# Patient Record
Sex: Female | Born: 1935 | Race: Black or African American | Hispanic: No | Smoking: Former smoker
Health system: Southern US, Community
[De-identification: ages and names within clinical notes are randomized; demographics above are authoritative.]

## PROBLEM LIST (undated history)

## (undated) DIAGNOSIS — K219 Gastro-esophageal reflux disease without esophagitis: Secondary | ICD-10-CM

## (undated) DIAGNOSIS — F32A Depression, unspecified: Secondary | ICD-10-CM

## (undated) DIAGNOSIS — F329 Major depressive disorder, single episode, unspecified: Secondary | ICD-10-CM

## (undated) DIAGNOSIS — E039 Hypothyroidism, unspecified: Secondary | ICD-10-CM

## (undated) HISTORY — PX: EYE SURGERY: SHX253

## (undated) HISTORY — PX: COLONOSCOPY: SHX174

---

## 1999-01-03 ENCOUNTER — Encounter: Payer: Self-pay | Admitting: Cardiology

## 1999-01-03 ENCOUNTER — Ambulatory Visit (HOSPITAL_COMMUNITY): Admission: RE | Admit: 1999-01-03 | Discharge: 1999-01-03 | Payer: Self-pay | Admitting: Cardiology

## 2003-07-01 ENCOUNTER — Encounter: Admission: RE | Admit: 2003-07-01 | Discharge: 2003-07-01 | Payer: Self-pay | Admitting: Cardiology

## 2003-07-03 ENCOUNTER — Encounter: Admission: RE | Admit: 2003-07-03 | Discharge: 2003-07-03 | Payer: Self-pay | Admitting: Cardiology

## 2005-11-24 ENCOUNTER — Ambulatory Visit (HOSPITAL_COMMUNITY): Admission: RE | Admit: 2005-11-24 | Discharge: 2005-11-24 | Payer: Self-pay | Admitting: Neurosurgery

## 2005-12-27 ENCOUNTER — Encounter: Admission: RE | Admit: 2005-12-27 | Discharge: 2005-12-27 | Payer: Self-pay | Admitting: Neurosurgery

## 2006-01-17 ENCOUNTER — Encounter: Admission: RE | Admit: 2006-01-17 | Discharge: 2006-01-17 | Payer: Self-pay | Admitting: Neurosurgery

## 2007-07-17 ENCOUNTER — Ambulatory Visit (HOSPITAL_COMMUNITY): Admission: RE | Admit: 2007-07-17 | Discharge: 2007-07-17 | Payer: Self-pay | Admitting: Cardiology

## 2007-07-17 IMAGING — US US ABDOMEN COMPLETE
1 series · 14 of 25 positions shown · non-contrast
Comparison: None.

CLINICAL DATA: Abdominal pain, left lower side pain.
 ABDOMEN ULTRASOUND:
TECHNIQUE: Complete abdominal ultrasound examination was performed including evaluation of the liver, gallbladder, bile ducts, pancreas, kidneys, spleen, IVC, and abdominal aorta.

[Series 1: unknown · 0.33mm/px · 14 of 58 slices shown]
[im 1/58]
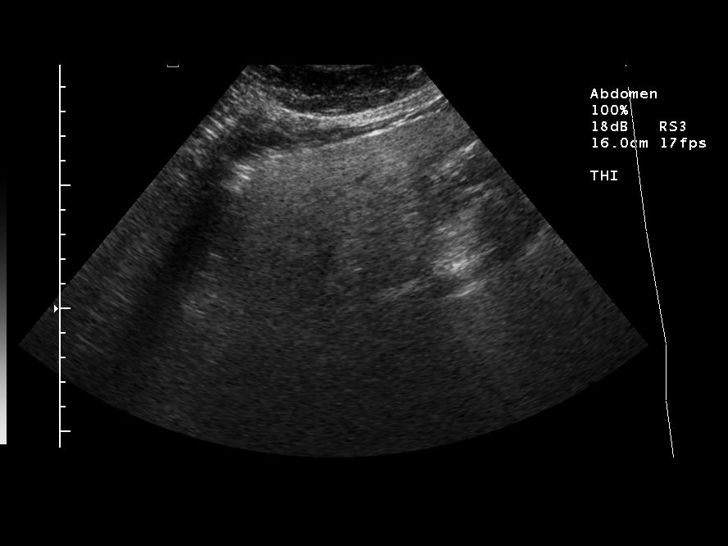
[im 5/58]
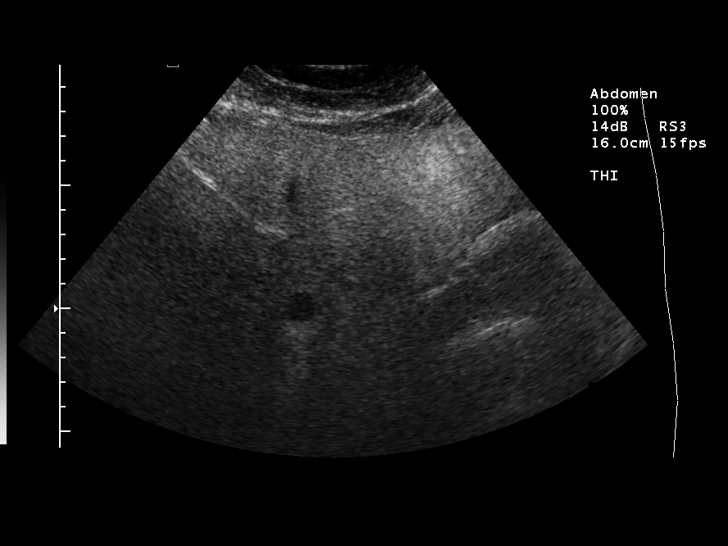
[im 10/58]
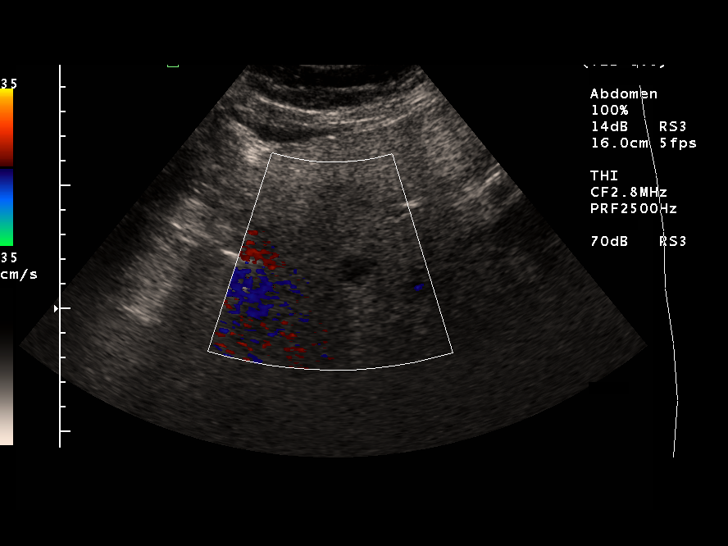
[im 15/58]
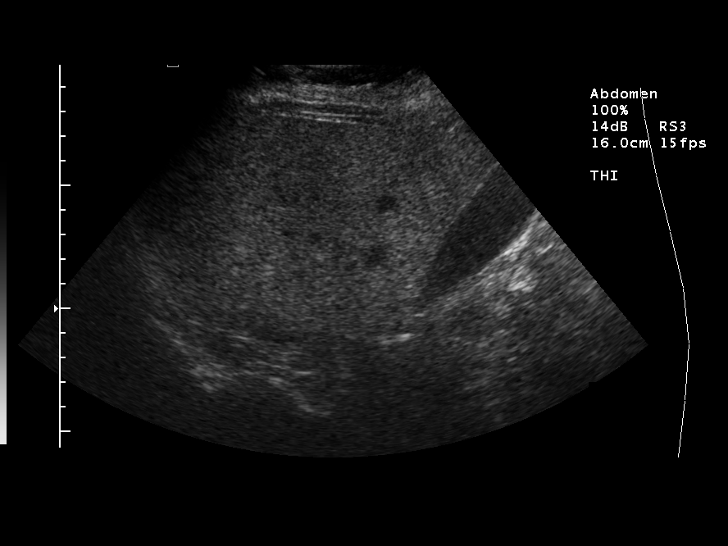
[im 20/58]
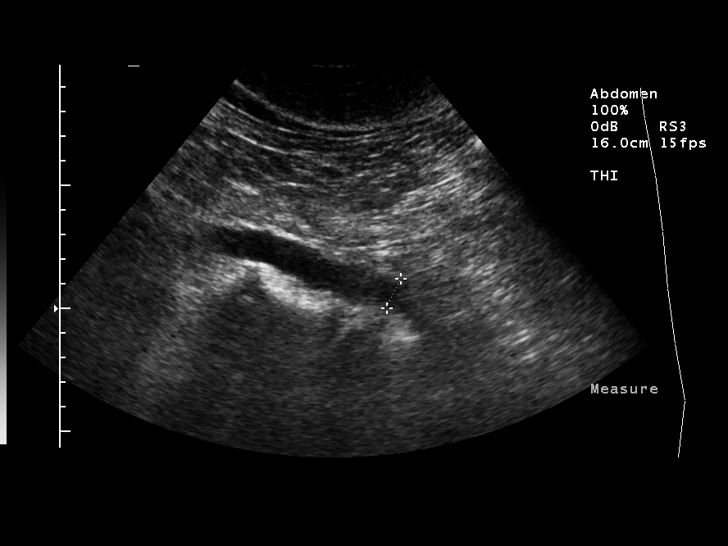
[im 22/58]
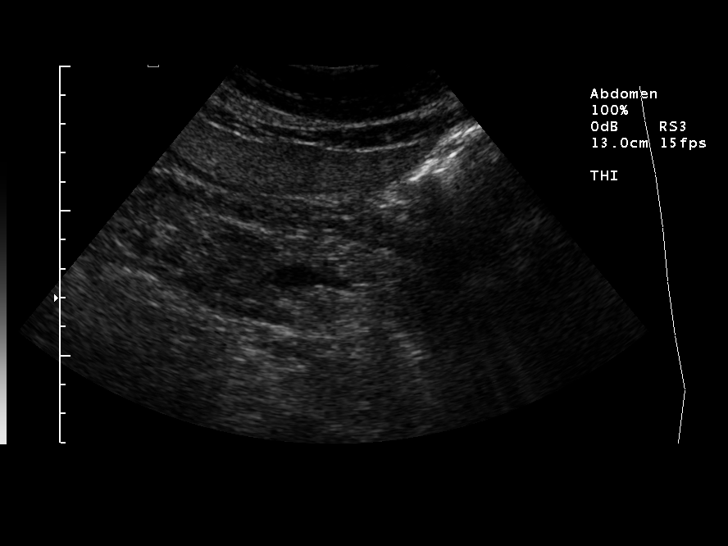
[im 27/58]
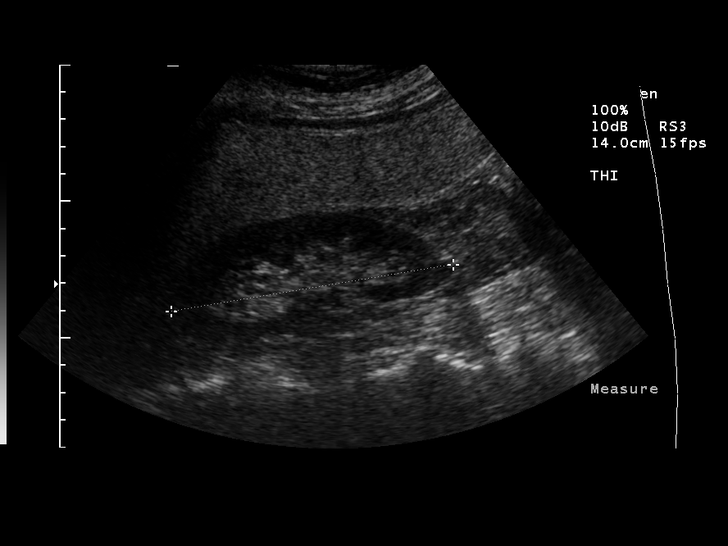
[im 31/58]
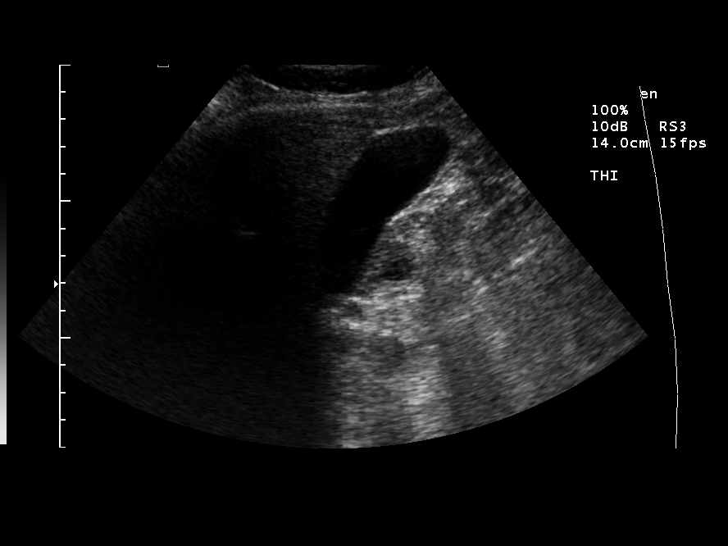
[im 36/58]
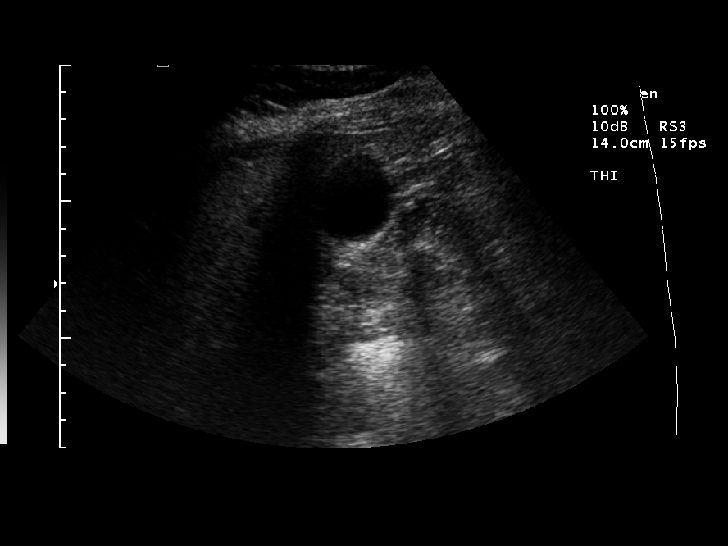
[im 39/58]
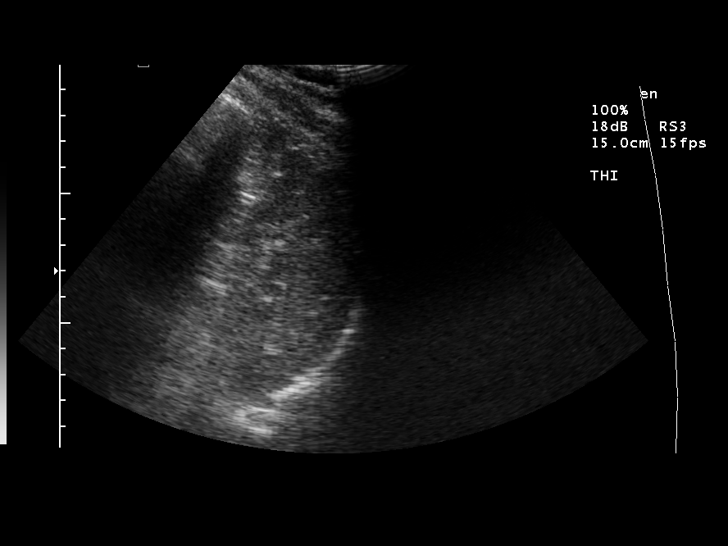
[im 43/58]
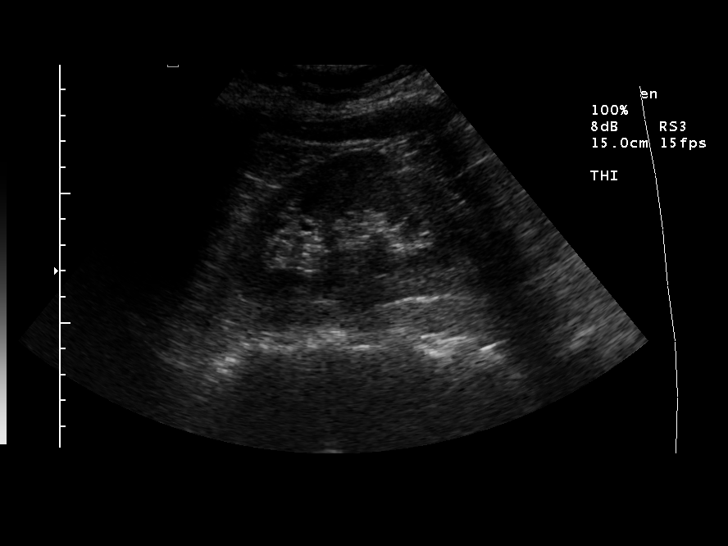
[im 48/58]
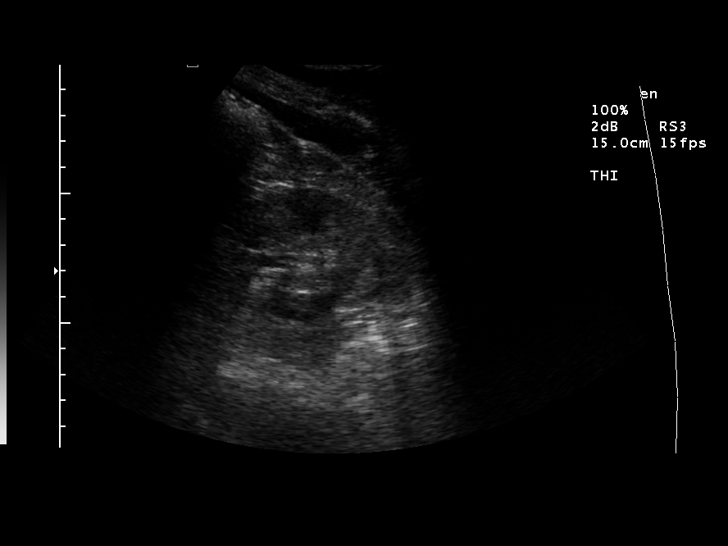
[im 53/58]
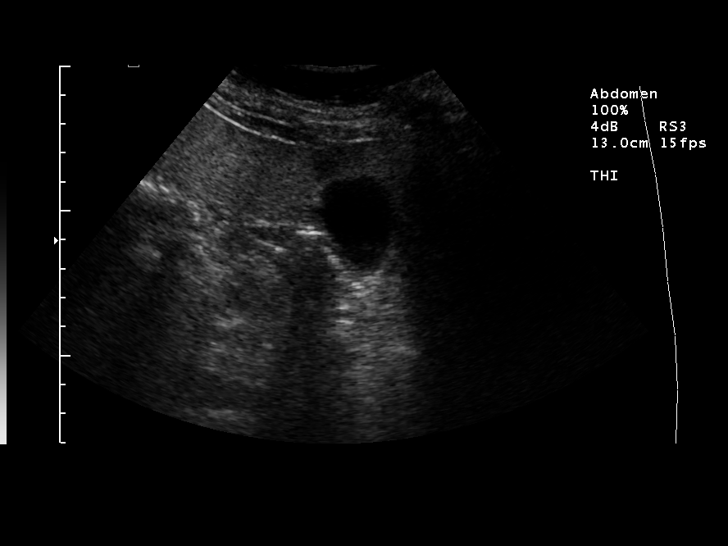
[im 58/58]
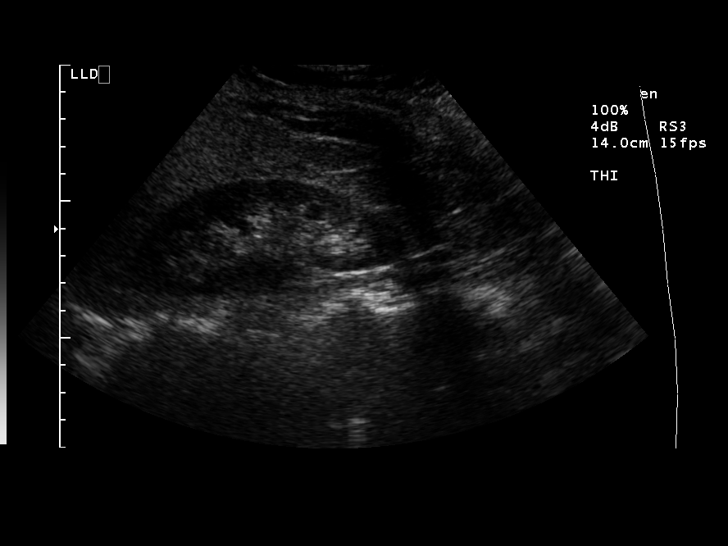

[14 of 25 positions shown; findings below may reference images not displayed]

FINDINGS: Liver is increased in echogenicity and contains a 1.4 cm anechoic lesion with increased through-transmission, consistent with a cyst.  Gallbladder, extrahepatic bile duct, IVC, pancreas, spleen, kidneys, and aorta unremarkable.
IMPRESSION: Fatty liver.

## 2007-11-14 ENCOUNTER — Encounter: Admission: RE | Admit: 2007-11-14 | Discharge: 2007-11-14 | Payer: Self-pay | Admitting: Neurosurgery

## 2008-06-19 HISTORY — PX: BACK SURGERY: SHX140

## 2008-06-23 ENCOUNTER — Emergency Department (HOSPITAL_COMMUNITY): Admission: EM | Admit: 2008-06-23 | Discharge: 2008-06-23 | Payer: Self-pay | Admitting: Emergency Medicine

## 2008-06-30 ENCOUNTER — Inpatient Hospital Stay (HOSPITAL_COMMUNITY): Admission: RE | Admit: 2008-06-30 | Discharge: 2008-07-03 | Payer: Self-pay | Admitting: Neurosurgery

## 2009-10-27 ENCOUNTER — Ambulatory Visit (HOSPITAL_COMMUNITY): Admission: RE | Admit: 2009-10-27 | Discharge: 2009-10-27 | Payer: Self-pay | Admitting: Cardiology

## 2010-07-10 ENCOUNTER — Encounter: Payer: Self-pay | Admitting: Cardiology

## 2010-10-03 LAB — CBC
HCT: 43.8 % (ref 36.0–46.0)
Hemoglobin: 14.1 g/dL (ref 12.0–15.0)
MCHC: 32.2 g/dL (ref 30.0–36.0)
Platelets: 298 10*3/uL (ref 150–400)
RBC: 5.07 MIL/uL (ref 3.87–5.11)
RDW: 13.8 % (ref 11.5–15.5)

## 2010-10-03 LAB — POCT I-STAT, CHEM 8
Calcium, Ion: 1.17 mmol/L (ref 1.12–1.32)
Glucose, Bld: 154 mg/dL — ABNORMAL HIGH (ref 70–99)
HCT: 43 % (ref 36.0–46.0)
Hemoglobin: 14.6 g/dL (ref 12.0–15.0)

## 2010-10-03 LAB — URINE MICROSCOPIC-ADD ON

## 2010-10-03 LAB — URINALYSIS, ROUTINE W REFLEX MICROSCOPIC
Bilirubin Urine: NEGATIVE
Glucose, UA: NEGATIVE mg/dL
Nitrite: NEGATIVE
Specific Gravity, Urine: 1.02 (ref 1.005–1.030)

## 2010-11-01 NOTE — Discharge Summary (Signed)
NAMEVEYDA, KAUFMAN                   ACCOUNT NO.:  [id number]   MEDICAL RECORD NO.:  [id number]          PATIENT TYPE:  INP   LOCATION:  3033                         FACILITY:  [facility]   PHYSICIAN:  Coletta Memos, M.D.     DATE OF BIRTH:  03-Mar-1933   DATE OF ADMISSION:  06/30/2008  DATE OF DISCHARGE:  07/03/2008                               DISCHARGE SUMMARY   ADMITTING DIAGNOSIS:  Recurrent disk herniation, left L4-5.   DISCHARGE DIAGNOSIS:  Recurrent disk herniation, left L4-5.   PROCEDURE:  Left L4-5 redo semihemilaminectomy and diskectomy with  microdissection.   COMPLICATIONS:  None.   DISCHARGE STATUS:  Alive and well.   MEDICATIONS:  1. Percocet for pain 1 p.o. q.6 p.r.n. pain 5/325.  2. Flexeril 10 mg p.o. t.i.d. p.r.n. spasms.   Wound is clean and dry.  No signs of infection at discharge.  She is  tolerating a regular diet, has voided and ambulated without difficulty.  Ms. Defrain presented with acute pain in left lower extremity secondary to  a displaced disk.  I took her to the operating room and she underwent an  uncomplicated diskectomy.  Postoperatively, she has done well.  She was  reluctant to leave the hospital and is now ready to leave.           ______________________________  Coletta Memos, M.D.     KC/MEDQ  D:  07/03/2008  T:  07/04/2008  Job:  [id number]

## 2010-11-01 NOTE — Op Note (Signed)
NAMECAMESHA, Gomez                   ACCOUNT NO.:  [id number]   MEDICAL RECORD NO.:  [id number]          PATIENT TYPE:  INP   LOCATION:  3033                         FACILITY:  [facility]   PHYSICIAN:  Coletta Memos, M.D.     DATE OF BIRTH:  1933-01-13   DATE OF PROCEDURE:  06/30/2008  DATE OF DISCHARGE:                               OPERATIVE REPORT   PREOPERATIVE DIAGNOSES:  1. Recurrent displaced disk, left L4-5.  2. Lumbar radiculopathy.   POSTOPERATIVE DIAGNOSES:  1. Recurrent displaced disk, left L4-5.  2. Lumbar radiculopathy.   PROCEDURE:  Redo diskectomy L4-5 with microdissection.   SURGEON:  Coletta Memos, MD   ASSISTANT:  Payton Doughty, MD   COMPLICATIONS:  None.   INDICATIONS:  [name] is a 77 year old who I have followed for a long  period of time in my office.  [name] has always denied surgery.  However, approximately 2 weeks ago she had acute onset of severe pain in  the left lower extremity, bad enough that she came to the emergency room  seeking medical attention.  MRI was performed and it showed a recurrent  displaced disk on the left side at L4-5.  She had had her operation in  the distant past well over 10 years ago.  I therefore elected to offer  her a operative solution and she elected to undergo operative  decompression.   OPERATIVE NOTE:  [name] was brought to the operating room, intubated  and placed under general anesthetic.  Her back was prepped and she was  draped in a sterile fashion after being rolled onto a [place] table.  A  Foley catheter was placed because it was certainly planned to have her  undergo a fusion if that was necessary.  I opened the old incision and  took this down to the thoracolumbar fascia.  I first localized the L2-3  disk space.  Then I opened the lower portion of her previous incision  and was able to then identify the L4-5 space.  I opened the disk space.  I performed a semi-hemilaminectomy using Kerrison punches and a  high-  speed drill.  Brought the microscope in and was able to remove some  overlying scar tissue.  I was then able to identify the thecal sac and  dissect around that using curved curettes.  I then was able to remove  more bone laterally until I felt that I had enough working room between  the thecal sac and the lateral margin of the bone.  The pars  interarticularis of L4 was intact and the facet site did not appear to  be particularly mobile, so I felt that I would be able to perform a  diskectomy without having to perform a fusion.  I then used the [facility] and was able to find the disk space and with that introduced  a number of curettes and rongeurs with Dr. Temple Pacini help and with  microdissection to empty the disk space and remove the herniated disk  material.  The thecal sac was very  well decompressed at the end of the  case.  Scar tissue were surrounding the nerve roots, but certainly the  pressure that had been there was removed.  I irrigated.  I then closed  with Dr. Temple Pacini assistance using Vicryl sutures in a layered fashion,  reapproximating the thoracolumbar fascia subcutaneous and in  subcuticular layers.  I used Dermabond for sterile dressing.  [name]  was then awakened, taken off the bed, extubated, and moving all  extremities.           ______________________________  Coletta Memos, M.D.     KC/MEDQ  D:  06/30/2008  T:  07/01/2008  Job:  [id number]

## 2014-02-09 ENCOUNTER — Other Ambulatory Visit: Payer: Self-pay | Admitting: Ophthalmology

## 2014-02-09 MED ORDER — TETRACAINE HCL 0.5 % OP SOLN: 1.0000 [drp] | OPHTHALMIC | Status: DC

## 2014-02-09 NOTE — H&P (Signed)
History & Physical:   DATE:   02-09-14  NAME:  [name], [name]     [id number]       HISTORY OF PRESENT ILLNESS: Chief Eye Complaints  Glaucoma  patient:  patient states it seems as if vision is decreasing, c/o having to wear glasses all the time. No pain or floaters.   HPI: EYES: Reports symptoms of  light sensitive , difficulty watching TV, ( see visual status form).  LOCATION:   BOTH EYES        QUALITY/COURSE:   Reports condition is worsening.        INTENSITY/SEVERITY:    Reports measurement ( or degree) as moderate.      DURATION:   Reports the general length of symptoms to be months.      ONSET/TIMING:   Reports occurrence as 2 months ago.     ACTIVE PROBLEMS: Nuclear cataract NOS   ICD10:   ICD9: 366.04  Onset: 02/09/2014 10:36  Initial Date:   OS  Primary open angle glaucoma   ICD9: 365.11  Onset:   Initial Date:   ICD10:   OU  Pseudophakia   ICD9: 446.0 Onset:  Initial Date:  ICD10: Z96.1 OD  SURGERIES: SLT OU 10/09/12 2013 phaco emulsion cataract extraction w/IOL OD Dr. Hershal Coria [place] referred to Dr. in [place], [place]  MEDICATIONS: Lorazepam: Strength-  SIG-  Dose-  Freq-   Nexium (Esomeprazole):  20 mg delayed release capsule  SIG-  1 cap(s)   once a day  Ultram (Tramadol):   50 mg tablet  SIG-  1 tab(s)    4 times a day   Meclizine  (Antivert):   12.5 mg tablet  SIG-  1 each   3 times a day as needed for dizziness   Levothyroxine Sodium: Strength-  SIG-  Dose-  Freq-   Simbrinza: 0.2%-1% suspension SIG-  1 gtt in each affected eye 3 times a day for 30 days  Ciloxan (Ciprofloxacin) Solution: 0.3% solution SIG-  2 drop(s)  every 4 hours   Ilevro: Strength-  SIG-  REVIEW OF SYSTEMS:  ROS:   GEN- Constitutional: HENT: Reports symptoms of sore throat.    GEN - Endocrine: Reports symptoms of  hypothyroid  LUNGS/Respiratory:  HEART/Cardiovascular: Reports symptoms of ABD/Gastrointestinal: Reports symptoms of  GERD   Musculoskeletal  (BJE): NEURO/Neurological: PSYCH/Psychiatric:    Is the pt oriented to time, place, person? 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  Father is dead. ALLERGIES: Xalatan (Latanoprost) Ophthalmic Solution:               eye irritation   PHYSICAL EXAMINATION: VS: BMI: 31.3.  BP: 120/76.  H: 60.00 in.  P: 66 /min.  RR: 20 /min.  W: 160lbs 0oz.    Va   OD:cc 20/25 PH:20/NI    OS:cc 20/50 ph 20/NI  EYEGLASSES:  OD:-2.00 + 2.50 x 001                                     UJ:WJXBJ + 1.50 x 178  ADD:  +2.75  MR 12/26/2013 11:27    OD:-2.25 + 2.25 x 001    20/20 OS:-1.00 + 1.25 x 178    20/40 ADD:+2.75  K's OD: 42.75 44.75 OS: 42.50 44.75  VF:   ZO:XWRUEAVW visual field OD reveals a superior arcuate defect                                             UJ:WJXBJYN arcuate and paracentral scotoma  Motility:orthophoria and full  PUPILS:3 mm round reactive negative Page Spiro  EYELIDS & OCULAR ADNEXA:Normal each eye  SLE: Conjunctiva:Quiet each eye  Cornea: WG:NFAOZH/YQMVHQ temporal incision   Decreased Tear Break-Up Time  IO:NGEXB   Decreased Tear Break-Up Time    anterior chamber:Deep and Quiet each eye  Iris:Brown each eye  Lens: OD: Posterior chamber intraocular lens implant OS:+3 nuclear sclerosis   Vitreous:  CCT:   Ta   in mmHg   OD 14       OS 16 Time: 02/09/2014 10:03   Gonio:  MW:UXLKG open 360 to ciliary body band peripheral anterior synechiae noted at 5:00 MW:NUUVO open 360 to scleral spur plus one pigment each eye   Dilation:  Fundus:  optic nerve   OD:rim intact with inferior sloping                                              ZD:GUYQIHKVQ vertical cup small nerves inferior rim slopes    Macula: QV:ZDGLO                                                   VF:IEPPI  Vessels:Normal  Periphery:Normal    Exam: GENERAL: Appearance: HEAD, EARS, NOSE AND THROAT: Ears-Nose (external) Inspection: Externally, nose and ears are normal in appearance and without scars, lesions, or nodules.      Hearing assessment shows no problems with normal conversation.      LUNGS and RESPIRATORY: Lung auscultation elicits no wheezing, rhonci, rales or rubs and with equal breath sounds.    Respiratory effort described as breathing is unlabored and chest movement is symmetrical.    HEART (Cardiovascular): Heart auscultation discovers regular rate and rhythm; no murmur, gallop or rub. Normal heart sounds.    ABDOMEN (Gastrointestinal): Mass/Tenderness Exam: Neither are present.     MUSCULOSKELETAL (BJE): Inspection-Palpation: No major bone, joint, tendon, or muscle changes.      NEUROLOGICAL: Alert and oriented. No major deficits of coordination or sensation.      PSYCHIATRIC: Insight and judgment appear  both to be intact and appropriate.    Mood and affect are described as normal mood and full affect.    SKIN: Skin Inspection: No rashes or lesions  ADMITTING DIAGNOSIS: Nuclear cataract NOS   ICD10:   ICD9: 366.04  Onset: 02/09/2014 10:36  Initial Date:   OS  Primary open angle glaucoma   ICD9: 365.11  Onset:    OU  Pseudophakia   ICD9: 446.0 Onset:   OD  SURGICAL TREATMENT PLAN: phaco emulsion cataract extraction  intraocular lens implant  with  intraocular lens implant  OS   Risk  and benefits of surgery have been reviewed with the patient and the patient agrees to proceed with the surgical procedure.       ___________________________ Chalmers Guest, [name] Borderline diabetic vertigo or dizziness  Starter - Inactive Problems:

## 2014-02-10 ENCOUNTER — Encounter (HOSPITAL_COMMUNITY): Payer: Self-pay | Admitting: *Deleted

## 2014-02-11 ENCOUNTER — Encounter (HOSPITAL_COMMUNITY)
Admission: RE | Disposition: A | Discharge status: Home / Self Care | Payer: Self-pay | Source: Ambulatory Visit | Attending: Ophthalmology

## 2014-02-11 ENCOUNTER — Encounter (HOSPITAL_COMMUNITY): Payer: Medicare Other | Admitting: Anesthesiology

## 2014-02-11 ENCOUNTER — Encounter (HOSPITAL_COMMUNITY): Payer: Self-pay | Admitting: Anesthesiology

## 2014-02-11 ENCOUNTER — Ambulatory Visit (HOSPITAL_COMMUNITY)
Admission: RE | Admit: 2014-02-11 | Discharge: 2014-02-11 | Disposition: A | Discharge status: Home / Self Care | Payer: Medicare Other | Source: Ambulatory Visit | Attending: Ophthalmology | Admitting: Ophthalmology

## 2014-02-11 ENCOUNTER — Ambulatory Visit (HOSPITAL_COMMUNITY): Payer: Medicare Other | Admitting: Anesthesiology

## 2014-02-11 DIAGNOSIS — H269 Unspecified cataract: Secondary | ICD-10-CM | POA: Insufficient documentation

## 2014-02-11 DIAGNOSIS — Z87891 Personal history of nicotine dependence: Secondary | ICD-10-CM | POA: Diagnosis not present

## 2014-02-11 DIAGNOSIS — K219 Gastro-esophageal reflux disease without esophagitis: Secondary | ICD-10-CM | POA: Diagnosis not present

## 2014-02-11 DIAGNOSIS — E039 Hypothyroidism, unspecified: Secondary | ICD-10-CM | POA: Diagnosis not present

## 2014-02-11 DIAGNOSIS — H4011X0 Primary open-angle glaucoma, stage unspecified: Secondary | ICD-10-CM | POA: Diagnosis not present

## 2014-02-11 HISTORY — DX: Hypothyroidism, unspecified: E03.9

## 2014-02-11 HISTORY — DX: Major depressive disorder, single episode, unspecified: F32.9

## 2014-02-11 HISTORY — DX: Gastro-esophageal reflux disease without esophagitis: K21.9

## 2014-02-11 HISTORY — PX: CATARACT EXTRACTION W/PHACO: SHX586

## 2014-02-11 HISTORY — DX: Depression, unspecified: F32.A

## 2014-02-11 LAB — CBC
HCT: 37 % (ref 36.0–46.0)
Hemoglobin: 11.9 g/dL — ABNORMAL LOW (ref 12.0–15.0)
MCH: 27.1 pg (ref 26.0–34.0)
MCHC: 32.2 g/dL (ref 30.0–36.0)
MCV: 84.3 fL (ref 78.0–100.0)
PLATELETS: 224 10*3/uL (ref 150–400)
RBC: 4.39 MIL/uL (ref 3.87–5.11)
RDW: 12.5 % (ref 11.5–15.5)
WBC: 7 10*3/uL (ref 4.0–10.5)

## 2014-02-11 SURGERY — PHACOEMULSIFICATION, CATARACT, WITH IOL INSERTION
Anesthesia: Monitor Anesthesia Care | Site: Eye | Laterality: Left

## 2014-02-11 MED ORDER — MIDAZOLAM HCL 5 MG/5ML IJ SOLN
INTRAMUSCULAR | Status: DC | PRN
  Administered 2014-02-11 (×2): 0.5 mg via INTRAVENOUS

## 2014-02-11 MED ORDER — BSS IO SOLN
INTRAOCULAR | Status: AC
  Filled 2014-02-11: qty 500

## 2014-02-11 MED ORDER — MIDAZOLAM HCL 2 MG/2ML IJ SOLN
INTRAMUSCULAR | Status: AC
  Filled 2014-02-11: qty 2

## 2014-02-11 MED ORDER — TROPICAMIDE 1 % OP SOLN
1.0000 [drp] | OPHTHALMIC | Status: AC | PRN
  Administered 2014-02-11 (×3): 1 [drp] via OPHTHALMIC

## 2014-02-11 MED ORDER — TROPICAMIDE 1 % OP SOLN
1.0000 [drp] | OPHTHALMIC | Status: DC
  Filled 2014-02-11: qty 3

## 2014-02-11 MED ORDER — ACETYLCHOLINE CHLORIDE 1:100 IO SOLR
INTRAOCULAR | Status: DC | PRN
  Administered 2014-02-11: 20 mg via INTRAOCULAR

## 2014-02-11 MED ORDER — PILOCARPINE HCL 4 % OP SOLN
OPHTHALMIC | Status: AC
  Filled 2014-02-11: qty 15

## 2014-02-11 MED ORDER — HYALURONIDASE HUMAN 150 UNIT/ML IJ SOLN
INTRAMUSCULAR | Status: AC
  Filled 2014-02-11: qty 1

## 2014-02-11 MED ORDER — BUPIVACAINE HCL (PF) 0.75 % IJ SOLN
INTRAMUSCULAR | Status: AC
  Filled 2014-02-11: qty 10

## 2014-02-11 MED ORDER — SUCCINYLCHOLINE CHLORIDE 20 MG/ML IJ SOLN
INTRAMUSCULAR | Status: AC
  Filled 2014-02-11: qty 1

## 2014-02-11 MED ORDER — PROPOFOL 10 MG/ML IV BOLUS
INTRAVENOUS | Status: DC | PRN
  Administered 2014-02-11: 20 mg via INTRAVENOUS
  Administered 2014-02-11: 30 mg via INTRAVENOUS

## 2014-02-11 MED ORDER — GLYCOPYRROLATE 0.2 MG/ML IJ SOLN
INTRAMUSCULAR | Status: AC
  Filled 2014-02-11: qty 1

## 2014-02-11 MED ORDER — ONDANSETRON HCL 4 MG/2ML IJ SOLN
INTRAMUSCULAR | Status: AC
  Filled 2014-02-11: qty 2

## 2014-02-11 MED ORDER — ONDANSETRON HCL 4 MG/2ML IJ SOLN
INTRAMUSCULAR | Status: DC | PRN
  Administered 2014-02-11: 4 mg via INTRAVENOUS

## 2014-02-11 MED ORDER — CYCLOPENTOLATE HCL 1 % OP SOLN
1.0000 [drp] | OPHTHALMIC | Status: AC | PRN
  Administered 2014-02-11 (×3): 1 [drp] via OPHTHALMIC
  Filled 2014-02-11: qty 2

## 2014-02-11 MED ORDER — CYCLOPENTOLATE HCL 1 % OP SOLN
1.0000 [drp] | OPHTHALMIC | Status: DC
  Filled 2014-02-11: qty 2

## 2014-02-11 MED ORDER — LIDOCAINE-EPINEPHRINE 2 %-1:100000 IJ SOLN
INTRAMUSCULAR | Status: DC | PRN
  Administered 2014-02-11: 09:00:00 via RETROBULBAR

## 2014-02-11 MED ORDER — EPINEPHRINE HCL 1 MG/ML IJ SOLN
INTRAMUSCULAR | Status: DC | PRN
  Administered 2014-02-11: 09:00:00

## 2014-02-11 MED ORDER — ACETYLCHOLINE CHLORIDE 1:100 IO SOLR
INTRAOCULAR | Status: AC
  Filled 2014-02-11: qty 1

## 2014-02-11 MED ORDER — LIDOCAINE HCL (CARDIAC) 20 MG/ML IV SOLN
INTRAVENOUS | Status: AC
  Filled 2014-02-11: qty 5

## 2014-02-11 MED ORDER — PHENYLEPHRINE HCL 2.5 % OP SOLN
1.0000 [drp] | OPHTHALMIC | Status: DC
  Filled 2014-02-11: qty 2

## 2014-02-11 MED ORDER — TOBRAMYCIN 0.3 % OP OINT
TOPICAL_OINTMENT | OPHTHALMIC | Status: DC | PRN
  Administered 2014-02-11: 1 via OPHTHALMIC

## 2014-02-11 MED ORDER — GENTAMICIN SULFATE 40 MG/ML IJ SOLN
INTRAMUSCULAR | Status: AC
  Filled 2014-02-11: qty 2

## 2014-02-11 MED ORDER — BSS IO SOLN
INTRAOCULAR | Status: DC | PRN
  Administered 2014-02-11: 15 mL via INTRAOCULAR

## 2014-02-11 MED ORDER — PROPOFOL 10 MG/ML IV BOLUS
INTRAVENOUS | Status: AC
  Filled 2014-02-11: qty 20

## 2014-02-11 MED ORDER — KETOROLAC TROMETHAMINE 0.5 % OP SOLN
1.0000 [drp] | OPHTHALMIC | Status: AC | PRN
  Administered 2014-02-11 (×3): 1 [drp] via OPHTHALMIC

## 2014-02-11 MED ORDER — LIDOCAINE HCL 2 % IJ SOLN
INTRAMUSCULAR | Status: AC
  Filled 2014-02-11: qty 20

## 2014-02-11 MED ORDER — NA CHONDROIT SULF-NA HYALURON 40-30 MG/ML IO SOLN
INTRAOCULAR | Status: AC
  Filled 2014-02-11: qty 0.5

## 2014-02-11 MED ORDER — SODIUM HYALURONATE 10 MG/ML IO SOLN
INTRAOCULAR | Status: AC
  Filled 2014-02-11: qty 0.85

## 2014-02-11 MED ORDER — NA CHONDROIT SULF-NA HYALURON 40-30 MG/ML IO SOLN
INTRAOCULAR | Status: DC | PRN
  Administered 2014-02-11: 0.5 mL via INTRAOCULAR

## 2014-02-11 MED ORDER — TETRACAINE HCL 0.5 % OP SOLN
OPHTHALMIC | Status: DC | PRN
  Administered 2014-02-11: 1 [drp]

## 2014-02-11 MED ORDER — PHENYLEPHRINE HCL 2.5 % OP SOLN
1.0000 [drp] | OPHTHALMIC | Status: AC | PRN
  Administered 2014-02-11 (×3): 1 [drp] via OPHTHALMIC

## 2014-02-11 MED ORDER — ROCURONIUM BROMIDE 50 MG/5ML IV SOLN
INTRAVENOUS | Status: AC
  Filled 2014-02-11: qty 1

## 2014-02-11 MED ORDER — BSS IO SOLN
INTRAOCULAR | Status: AC
  Filled 2014-02-11: qty 15

## 2014-02-11 MED ORDER — EPINEPHRINE HCL 1 MG/ML IJ SOLN
INTRAMUSCULAR | Status: AC
  Filled 2014-02-11: qty 1

## 2014-02-11 MED ORDER — LIDOCAINE-EPINEPHRINE 2 %-1:100000 IJ SOLN
INTRAMUSCULAR | Status: AC
  Filled 2014-02-11: qty 1

## 2014-02-11 MED ORDER — LIDOCAINE HCL (CARDIAC) 20 MG/ML IV SOLN
INTRAVENOUS | Status: DC | PRN
  Administered 2014-02-11: 30 mg via INTRAVENOUS

## 2014-02-11 MED ORDER — TETRACAINE HCL 0.5 % OP SOLN
OPHTHALMIC | Status: AC
  Filled 2014-02-11: qty 2

## 2014-02-11 MED ORDER — SODIUM CHLORIDE 0.9 % IJ SOLN
INTRAMUSCULAR | Status: AC
  Filled 2014-02-11: qty 10

## 2014-02-11 MED ORDER — GATIFLOXACIN 0.5 % OP SOLN
1.0000 [drp] | OPHTHALMIC | Status: AC | PRN
  Administered 2014-02-11 (×3): 1 [drp] via OPHTHALMIC
  Filled 2014-02-11: qty 2.5

## 2014-02-11 MED ORDER — SODIUM HYALURONATE 10 MG/ML IO SOLN
INTRAOCULAR | Status: DC | PRN
  Administered 2014-02-11: 0.85 mL via INTRAOCULAR

## 2014-02-11 MED ORDER — TOBRAMYCIN-DEXAMETHASONE 0.3-0.1 % OP OINT
TOPICAL_OINTMENT | OPHTHALMIC | Status: AC
  Filled 2014-02-11: qty 3.5

## 2014-02-11 MED ORDER — EPHEDRINE SULFATE 50 MG/ML IJ SOLN
INTRAMUSCULAR | Status: AC
  Filled 2014-02-11: qty 1

## 2014-02-11 MED ORDER — LACTATED RINGERS IV SOLN
INTRAVENOUS | Status: DC | PRN
  Administered 2014-02-11: 08:00:00 via INTRAVENOUS

## 2014-02-11 MED ORDER — FENTANYL CITRATE 0.05 MG/ML IJ SOLN
INTRAMUSCULAR | Status: AC
  Filled 2014-02-11: qty 5

## 2014-02-11 MED ORDER — KETOROLAC TROMETHAMINE 0.5 % OP SOLN
1.0000 [drp] | OPHTHALMIC | Status: DC
  Filled 2014-02-11: qty 5

## 2014-02-11 MED ORDER — 0.9 % SODIUM CHLORIDE (POUR BTL) OPTIME
TOPICAL | Status: DC | PRN
  Administered 2014-02-11: 1000 mL

## 2014-02-11 MED ORDER — PHENYLEPHRINE 40 MCG/ML (10ML) SYRINGE FOR IV PUSH (FOR BLOOD PRESSURE SUPPORT)
PREFILLED_SYRINGE | INTRAVENOUS | Status: AC
  Filled 2014-02-11: qty 10

## 2014-02-11 MED ORDER — DEXAMETHASONE SODIUM PHOSPHATE 10 MG/ML IJ SOLN
INTRAMUSCULAR | Status: AC
  Filled 2014-02-11: qty 1

## 2014-02-11 SURGICAL SUPPLY — 38 items
APPLICATOR COTTON TIP 6IN STRL (MISCELLANEOUS) ×3 IMPLANT
APPLICATOR DR MATTHEWS STRL (MISCELLANEOUS) ×3 IMPLANT
BLADE KERATOME 2.75 (BLADE) ×2 IMPLANT
BLADE KERATOME 2.75MM (BLADE) ×1
CANNULA ANTERIOR CHAMBER 27GA (MISCELLANEOUS) ×3 IMPLANT
CORDS BIPOLAR (ELECTRODE) IMPLANT
COVER MAYO STAND STRL (DRAPES) ×3 IMPLANT
DRAPE OPHTHALMIC 40X48 W POUCH (DRAPES) ×3 IMPLANT
DRAPE RETRACTOR (MISCELLANEOUS) ×3 IMPLANT
GLOVE BIO SURGEON STRL SZ7.5 (GLOVE) ×3 IMPLANT
GLOVE BIO SURGEON STRL SZ8 (GLOVE) ×3 IMPLANT
GLOVE BIOGEL PI IND STRL 7.0 (GLOVE) ×1 IMPLANT
GLOVE BIOGEL PI INDICATOR 7.0 (GLOVE) ×2
GLOVE SURG SS PI 7.0 STRL IVOR (GLOVE) ×3 IMPLANT
GOWN STRL REUS W/ TWL LRG LVL3 (GOWN DISPOSABLE) ×1 IMPLANT
GOWN STRL REUS W/ TWL XL LVL3 (GOWN DISPOSABLE) ×1 IMPLANT
GOWN STRL REUS W/TWL LRG LVL3 (GOWN DISPOSABLE) ×2
GOWN STRL REUS W/TWL XL LVL3 (GOWN DISPOSABLE) ×2
KIT BASIN OR (CUSTOM PROCEDURE TRAY) ×3 IMPLANT
KIT ROOM TURNOVER OR (KITS) ×3 IMPLANT
LENS IOL ACRSF IQ PC 22.0 (Intraocular Lens) ×1 IMPLANT
LENS IOL ACRYSOF IQ POST 22.0 (Intraocular Lens) ×3 IMPLANT
NEEDLE 18GX1X1/2 (RX/OR ONLY) (NEEDLE) ×3 IMPLANT
NEEDLE 25GX 5/8IN NON SAFETY (NEEDLE) ×6 IMPLANT
NEEDLE FILTER BLUNT 18X 1/2SAF (NEEDLE) ×2
NEEDLE FILTER BLUNT 18X1 1/2 (NEEDLE) ×1 IMPLANT
NS IRRIG 1000ML POUR BTL (IV SOLUTION) ×3 IMPLANT
PACK CATARACT CUSTOM (CUSTOM PROCEDURE TRAY) ×3 IMPLANT
PAD ARMBOARD 7.5X6 YLW CONV (MISCELLANEOUS) ×3 IMPLANT
PAK PIK CVS CATARACT (OPHTHALMIC) ×3 IMPLANT
SUT ETHILON 10 0 CS140 6 (SUTURE) IMPLANT
SUT SILK 6 0 G 6 (SUTURE) IMPLANT
SYR TB 1ML LUER SLIP (SYRINGE) ×3 IMPLANT
TAPE PAPER 2X10 WHT MICROPORE (GAUZE/BANDAGES/DRESSINGS) ×3 IMPLANT
TIP ABS 45DEG FLARED 0.9MM (TIP) ×3 IMPLANT
TOWEL OR 17X26 10 PK STRL BLUE (TOWEL DISPOSABLE) ×3 IMPLANT
WATER STERILE IRR 1000ML POUR (IV SOLUTION) ×3 IMPLANT
WIPE INSTRUMENT VISIWIPE 73X73 (MISCELLANEOUS) ×3 IMPLANT

## 2014-02-11 NOTE — H&P (View-Only) (Signed)
                  History & Physical:   DATE:   02-09-14  NAME:  [name]     [id number]       HISTORY OF PRESENT ILLNESS: Chief Eye Complaints  Glaucoma  patient:  patient states it seems as if vision is decreasing, c/o having to wear glasses all the time. No pain or floaters.   HPI: EYES: Reports symptoms of  light sensitive , difficulty watching TV, ( see visual status form).  LOCATION:   BOTH EYES        QUALITY/COURSE:   Reports condition is worsening.        INTENSITY/SEVERITY:    Reports measurement ( or degree) as moderate.      DURATION:   Reports the general length of symptoms to be months.      ONSET/TIMING:   Reports occurrence as 2 months ago.     ACTIVE PROBLEMS: Nuclear cataract NOS   ICD10:   ICD9: 366.04  Onset: 02/09/2014 10:36  Initial Date:   OS  Primary open angle glaucoma   ICD9: 365.11  Onset:   Initial Date:   ICD10:   OU  Pseudophakia   ICD9: 446.0 Onset:  Initial Date:  ICD10: Z96.1 OD  SURGERIES: SLT OU 10/09/12 2013 phaco emulsion cataract extraction w/IOL OD Dr. Chamberlain [place] referred to Dr. in [place],   MEDICATIONS: Lorazepam: Strength-  SIG-  Dose-  Freq-   Nexium (Esomeprazole):  20 mg delayed release capsule  SIG-  1 cap(s)   once a day  Ultram (Tramadol):   50 mg tablet  SIG-  1 tab(s)    4 times a day   Meclizine  (Antivert):   12.5 mg tablet  SIG-  1 each   3 times a day as needed for dizziness   Levothyroxine Sodium: Strength-  SIG-  Dose-  Freq-   Simbrinza: 0.2%-1% suspension SIG-  1 gtt in each affected eye 3 times a day for 30 days  Ciloxan (Ciprofloxacin) Solution: 0.3% solution SIG-  2 drop(s)  every 4 hours   Ilevro: Strength-  SIG-  REVIEW OF SYSTEMS:  ROS:   GEN- Constitutional: HENT: Reports symptoms of sore throat.    GEN - Endocrine: Reports symptoms of  hypothyroid  LUNGS/Respiratory:  HEART/Cardiovascular: Reports symptoms of ABD/Gastrointestinal: Reports symptoms of  GERD   Musculoskeletal  (BJE): NEURO/Neurological: PSYCH/Psychiatric:    Is the pt oriented to time, place, person? 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  Father is dead. ALLERGIES: Xalatan (Latanoprost) Ophthalmic Solution:               eye irritation   PHYSICAL EXAMINATION: VS: BMI: 31.3.  BP: 120/76.  H: 60.00 in.  P: 66 /min.  RR: 20 /min.  W: 160lbs 0oz.    Va   OD:cc 20/25 PH:20/NI    OS:cc 20/50 ph 20/NI  EYEGLASSES:  OD:-2.00 + 2.50 x 001                                     OS:plano + 1.50 x 178   ADD:  +2.75  MR 12/26/2013 11:27    OD:-2.25 + 2.25 x 001    20/20 OS:-1.00 + 1.25 x 178    20/40 ADD:+2.75  K's OD: 42.75 44.75 OS: 42.50 44.75  VF:   OD:Humphrey visual field OD reveals a superior arcuate defect                                             OS:uperior arcuate and paracentral scotoma  Motility:orthophoria and full  PUPILS:3 mm round reactive negative Marcus Gunn  EYELIDS & OCULAR ADNEXA:Normal each eye  SLE: Conjunctiva:Quiet each eye  Cornea: OD:Arcusw/sealed temporal incision   Decreased Tear Break-Up Time  OS:Arcus   Decreased Tear Break-Up Time    anterior chamber:Deep and Quiet each eye  Iris:Brown each eye  Lens: OD: Posterior chamber intraocular lens implant OS:+3 nuclear sclerosis   Vitreous:  CCT:   Ta   in mmHg   OD 14       OS 16 Time: 02/09/2014 10:03   Gonio:  OD:Angle open 360 to ciliary body band peripheral anterior synechiae noted at 5:00 OS:Angle open 360 to scleral spur plus one pigment each eye   Dilation:  Fundus:  optic nerve   OD:rim intact with inferior sloping                                              OS:increased vertical cup small nerves inferior rim slopes    Macula: OD:clear                                                   OS:clear  Vessels:Normal  Periphery:Normal    Exam: GENERAL: Appearance: HEAD, EARS, NOSE AND THROAT: Ears-Nose (external) Inspection: Externally, nose and ears are normal in appearance and without scars, lesions, or nodules.      Hearing assessment shows no problems with normal conversation.      LUNGS and RESPIRATORY: Lung auscultation elicits no wheezing, rhonci, rales or rubs and with equal breath sounds.    Respiratory effort described as breathing is unlabored and chest movement is symmetrical.    HEART (Cardiovascular): Heart auscultation discovers regular rate and rhythm; no murmur, gallop or rub. Normal heart sounds.    ABDOMEN (Gastrointestinal): Mass/Tenderness Exam: Neither are present.     MUSCULOSKELETAL (BJE): Inspection-Palpation: No major bone, joint, tendon, or muscle changes.      NEUROLOGICAL: Alert and oriented. No major deficits of coordination or sensation.      PSYCHIATRIC: Insight and judgment appear  both to be intact and appropriate.    Mood and affect are described as normal mood and full affect.    SKIN: Skin Inspection: No rashes or lesions  ADMITTING DIAGNOSIS: Nuclear cataract NOS   ICD10:   ICD9: 366.04  Onset: 02/09/2014 10:36  Initial Date:   OS  Primary open angle glaucoma   ICD9: 365.11  Onset:    OU  Pseudophakia   ICD9: 446.0 Onset:   OD  SURGICAL TREATMENT PLAN: phaco emulsion cataract extraction  intraocular lens implant  with  intraocular lens implant  OS   Risk   and benefits of surgery have been reviewed with the patient and the patient agrees to proceed with the surgical procedure.       ___________________________ [name], [name] Borderline diabetic vertigo or dizziness  Starter - Inactive Problems:  

## 2014-02-11 NOTE — Anesthesia Procedure Notes (Signed)
Procedure Name: MAC Date/Time: 02/11/2014 8:35 AM Performed by: Lovie Chol Pre-anesthesia Checklist: Patient identified, Emergency Drugs available, Suction available, Patient being monitored and Timeout performed Patient Re-evaluated:Patient Re-evaluated prior to inductionOxygen Delivery Method: Simple face mask

## 2014-02-11 NOTE — Anesthesia Postprocedure Evaluation (Signed)
  Anesthesia Post-op Note  Patient: [name]  Procedure(s) Performed: Procedure(s): CATARACT EXTRACTION PHACO AND INTRAOCULAR LENS PLACEMENT (IOC) LEFT EYE (Left)  Patient Location: PACU  Anesthesia Type:MAC  Level of Consciousness: awake  Airway and Oxygen Therapy: Patient Spontanous Breathing  Post-op Pain: none  Post-op Assessment: Post-op Vital signs reviewed, Patient's Cardiovascular Status Stable, Respiratory Function Stable, Patent Airway, No signs of Nausea or vomiting and Pain level controlled  Post-op Vital Signs: Reviewed and stable  Last Vitals:  Filed Vitals:   02/11/14 1040  BP: 112/67  Pulse: 53  Temp:   Resp:     Complications: No apparent anesthesia complications

## 2014-02-11 NOTE — Transfer of Care (Signed)
Immediate Anesthesia Transfer of Care Note  Patient: [name]  Procedure(s) Performed: Procedure(s): CATARACT EXTRACTION PHACO AND INTRAOCULAR LENS PLACEMENT (IOC) LEFT EYE (Left)  Patient Location: PACU  Anesthesia Type:MAC  Level of Consciousness: awake, alert , oriented and patient cooperative  Airway & Oxygen Therapy: Patient Spontanous Breathing  Post-op Assessment: Report given to PACU RN and Post -op Vital signs reviewed and stable  Post vital signs: Reviewed  Complications: No apparent anesthesia complications

## 2014-02-11 NOTE — Anesthesia Preprocedure Evaluation (Addendum)
Anesthesia Evaluation  Patient identified by MRN, date of birth, ID band Patient awake    Reviewed: Allergy & Precautions, H&P , NPO status , Patient's Chart, lab work & pertinent test results  History of Anesthesia Complications Negative for: history of anesthetic complications  Airway Mallampati: II TM Distance: >3 FB Neck ROM: Full    Dental  (+) Teeth Intact, Dental Advisory Given   Pulmonary neg shortness of breath, neg sleep apnea, neg COPDneg recent URI, former smoker,  breath sounds clear to auscultation        Cardiovascular negative cardio ROS  Rhythm:Regular     Neuro/Psych negative neurological ROS     GI/Hepatic negative GI ROS, Neg liver ROS, GERD-  Controlled and Medicated,  Endo/Other  neg diabetesHypothyroidism   Renal/GU negative Renal ROS     Musculoskeletal   Abdominal   Peds  Hematology negative hematology ROS (+)   Anesthesia Other Findings   Reproductive/Obstetrics negative OB ROS                          Anesthesia Physical Anesthesia Plan  ASA: II  Anesthesia Plan: MAC   Post-op Pain Management:    Induction: Intravenous  Airway Management Planned: Natural Airway  Additional Equipment: None  Intra-op Plan:   Post-operative Plan:   Informed Consent: I have reviewed the patients History and Physical, chart, labs and discussed the procedure including the risks, benefits and alternatives for the proposed anesthesia with the patient or authorized representative who has indicated his/her understanding and acceptance.   Dental advisory given  Plan Discussed with: CRNA and Surgeon  Anesthesia Plan Comments:         Anesthesia Quick Evaluation

## 2014-02-11 NOTE — Op Note (Signed)
Preoperative diagnosis: Visually significant cataract left eye Postoperative diagnosis: Same Procedure: Phacoemulsification with intraocular lens implant Consultations: None Anesthesia: 2% Xylocaine with epinephrine in a 50-50 mixture of 0.75% Marcaine with ample [place] Assistant: Mylee Procedure: The patient was transported to the operating room where she was given a peribulbar block with the aforementioned local anesthetic agent. Following this the patient's face was prepped and draped in the usual sterile fashion with a surgeon sitting temporally the operating microscope in position a Weck-Cel sponges used to fixate the globe and a 15 blade was used to enter through inferior clear cornea and Viscoat was injected into the anterior chamber. Following this with an additional Weck-Cel sponge a 2.75 mm keratome blade was used in a stepwise fashion through temporal clear cornea additional Viscoat was injected. A bent 25-gauge needle was then used to incise the anterior capsule and a continuous tear curvilinear capsulorrhexis was formed. BSS was used to hydrodissect and hydrodelineate the nucleus and the nucleus was loosened with in the capsular bag. The phacoemulsification unit was then used to remove the epinucleus the nucleus was sculpted centrally using a snap hook and difficult tip the nucleus was snapped into 4 quadrants and all nuclear fragments were removed from the eye the posterior capsule remaining intact. The irrigation aspiration device was then used to strip cortical fibers from the posterior capsule after most of the cortical fibers had been removed except for the subincisional cortex. The Provisc was injected in the eye the intraocular lens implant was examined and noted to have no defects the lens was an Alcon AcrySof SN 60 WF AcrySof IQ lens 22.0 diopters SN #[id number] the lens was injected into the capsular bag and rotated with a Kuglen hook the subincisional cortex was removed with the  irrigation aspiration device as well as viscoelastic following this the eye was hydrated. The eye was pressurized a single 10-0 nylon suture was placed and the incision was checked and there was no leakage Miochol was injected in the eye the pupil was noted noted to come down symmetrically round. All instruments were then removed from the eye. Topical TobraDex ointment was applied to the eye a patch and Fox shield were placed and the patient returned to recovery area in stable condition Chalmers Guest Junior M.D.

## 2014-02-11 NOTE — Discharge Instructions (Signed)
The patient may remove the eye patch today at 1:30 PM and applied the eye drops that were given to her at the office. He should not press the eye avoid heavy lifting bending and straining. When sleeping the patient should sleep on her back or right side. The patient experiences pain she can take her usual pain medication or Tylenol if this does not relieve the pain the patient should call the office telephone number at any time.

## 2014-02-11 NOTE — Interval H&P Note (Signed)
History and Physical Interval Note:  02/11/2014 8:27 AM  [name]  has presented today for surgery, with the diagnosis of Nuclear cataract, nonsenile, left eye  The various methods of treatment have been discussed with the patient and family. After consideration of risks, benefits and other options for treatment, the patient has consented to  Procedure(s): CATARACT EXTRACTION PHACO AND INTRAOCULAR LENS PLACEMENT (IOC) LEFT EYE (Left) as a surgical intervention .  The patient's history has been reviewed, patient examined, no change in status, stable for surgery.  I have reviewed the patient's chart and labs.  Questions were answered to the patient's satisfaction.     Arlis Everly

## 2014-02-13 ENCOUNTER — Encounter (HOSPITAL_COMMUNITY): Payer: Self-pay | Admitting: Ophthalmology
